# Patient Record
Sex: Female | Born: 2005 | Race: White | Hispanic: No | Marital: Single | Smoking: Never smoker
Health system: Southern US, Community
[De-identification: ages and names within clinical notes are randomized; demographics above are authoritative.]

## PROBLEM LIST (undated history)

## (undated) DIAGNOSIS — F419 Anxiety disorder, unspecified: Secondary | ICD-10-CM

## (undated) DIAGNOSIS — N159 Renal tubulo-interstitial disease, unspecified: Secondary | ICD-10-CM

## (undated) DIAGNOSIS — S52209A Unspecified fracture of shaft of unspecified ulna, initial encounter for closed fracture: Secondary | ICD-10-CM

## (undated) DIAGNOSIS — I499 Cardiac arrhythmia, unspecified: Secondary | ICD-10-CM

## (undated) DIAGNOSIS — S5290XA Unspecified fracture of unspecified forearm, initial encounter for closed fracture: Secondary | ICD-10-CM

## (undated) DIAGNOSIS — N200 Calculus of kidney: Secondary | ICD-10-CM

## (undated) DIAGNOSIS — N83209 Unspecified ovarian cyst, unspecified side: Secondary | ICD-10-CM

## (undated) DIAGNOSIS — K219 Gastro-esophageal reflux disease without esophagitis: Secondary | ICD-10-CM

## (undated) DIAGNOSIS — Q796 Ehlers-Danlos syndrome, unspecified: Secondary | ICD-10-CM

## (undated) HISTORY — PX: SHOULDER SURGERY: SHX246

## (undated) HISTORY — DX: Unspecified fracture of shaft of unspecified ulna, initial encounter for closed fracture: S52.209A

## (undated) HISTORY — PX: ANKLE SURGERY: SHX546

## (undated) HISTORY — DX: Unspecified fracture of unspecified forearm, initial encounter for closed fracture: S52.90XA

---
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
## 2023-01-03 ENCOUNTER — Other Ambulatory Visit: Payer: Self-pay | Admitting: Urology

## 2023-01-05 NOTE — Progress Notes (Signed)
Sent message, via epic in basket, requesting orders in epic from surgeon.  

## 2023-01-06 NOTE — Patient Instructions (Addendum)
SURGICAL WAITING ROOM VISITATION  Patients having surgery or a procedure may have no more than 2 support people in the waiting area - these visitors may rotate.    Children under the age of 62 must have an adult with them who is not the patient.  Due to an increase in RSV and influenza rates and associated hospitalizations, children ages 12 and under may not visit patients in [place].  If the patient needs to stay at the hospital during part of their recovery, the visitor guidelines for inpatient rooms apply. Pre-op nurse will coordinate an appropriate time for 1 support person to accompany patient in pre-op.  This support person may not rotate.    Please refer to the [facility] website for the visitor guidelines for Inpatients (after your surgery is over and you are in a regular room).    Your procedure is scheduled on: 01/10/23   Report to [facility] Main Entrance    Report to admitting at 11:00 AM   Call this number if you have problems the morning of surgery [phone]   Do not eat food or drink liquids :After Midnight.          If you have questions, please contact your surgeon's office.   FOLLOW BOWEL PREP AND ANY ADDITIONAL PRE OP INSTRUCTIONS YOU RECEIVED FROM YOUR SURGEON'S OFFICE!!!     Oral Hygiene is also important to reduce your risk of infection.                                    Remember - BRUSH YOUR TEETH THE MORNING OF SURGERY WITH YOUR REGULAR TOOTHPASTE  DENTURES WILL BE REMOVED PRIOR TO SURGERY PLEASE DO NOT APPLY "Poly grip" OR ADHESIVES!!!   Take these medicines the morning of surgery with A SIP OF WATER: Buspar, Fluoxetine, Tamsulosin, Tramadol                               You may not have any metal on your body including hair pins, jewelry, and body piercing             Do not wear make-up, lotions, powders, perfumes, or deodorant  Do not wear nail polish including gel and S&S, artificial/acrylic nails, or any other type of  covering on natural nails including finger and toenails. If you have artificial nails, gel coating, etc. that needs to be removed by a nail salon please have this removed prior to surgery or surgery may need to be canceled/ delayed if the surgeon/ anesthesia feels like they are unable to be safely monitored.   Do not shave  48 hours prior to surgery.    Do not bring valuables to the hospital. [place].   Contacts, glasses, dentures or bridgework may not be worn into surgery.  DO NOT [place]. PHARMACY WILL DISPENSE MEDICATIONS LISTED ON YOUR MEDICATION LIST TO YOU DURING YOUR ADMISSION [place]!    Patients discharged on the day of surgery will not be allowed to drive home.  Someone NEEDS to stay with you for the first 24 hours after anesthesia.              Please read over the following fact  sheets you were given: IF YOU HAVE QUESTIONS ABOUT YOUR PRE-OP INSTRUCTIONS PLEASE CALL [phone] Schneiders    If you received a COVID test during your pre-op visit  it is requested that you wear a mask when out in public, stay away from anyone that may not be feeling well and notify your surgeon if you develop symptoms. If you test positive for Covid or have been in contact with anyone that has tested positive in the last 10 days please notify you surgeon.    [place] - Preparing for Surgery Before surgery, you can play an important role.  Because skin is not sterile, your skin needs to be as free of germs as possible.  You can reduce the number of germs on your skin by washing with CHG (chlorahexidine gluconate) soap before surgery.  CHG is an antiseptic cleaner which kills germs and bonds with the skin to continue killing germs even after washing. Please DO NOT use if you have an allergy to CHG or antibacterial soaps.  If your skin becomes reddened/irritated stop using the CHG and inform your nurse when you  arrive at Short Stay. Do not shave (including legs and underarms) for at least 48 hours prior to the first CHG shower.  You may shave your face/neck.  Please follow these instructions carefully:  1.  Shower with CHG Soap the night before surgery and the  morning of surgery.  2.  If you choose to wash your hair, wash your hair first as usual with your normal  shampoo.  3.  After you shampoo, rinse your hair and body thoroughly to remove the shampoo.                             4.  Use CHG as you would any other liquid soap.  You can apply chg directly to the skin and wash.  Gently with a scrungie or clean washcloth.  5.  Apply the CHG Soap to your body ONLY FROM THE NECK DOWN.   Do   not use on face/ open                           Wound or open sores. Avoid contact with eyes, ears mouth and   genitals (private parts).                       Wash face,  Genitals (private parts) with your normal soap.             6.  Wash thoroughly, paying special attention to the area where your    surgery  will be performed.  7.  Thoroughly rinse your body with warm water from the neck down.  8.  DO NOT shower/wash with your normal soap after using and rinsing off the CHG Soap.                9.  Pat yourself dry with a clean towel.            10.  Wear clean pajamas.            11.  Place clean sheets on your bed the night of your first shower and do not  sleep with pets. Day of Surgery : Do not apply any lotions/deodorants the morning of surgery.  Please wear clean clothes to the hospital/surgery center.  FAILURE TO FOLLOW THESE  INSTRUCTIONS MAY RESULT IN THE CANCELLATION OF YOUR SURGERY  PATIENT SIGNATURE_________________________________  NURSE SIGNATURE__________________________________  ________________________________________________________________________

## 2023-01-06 NOTE — Progress Notes (Addendum)
COVID Vaccine Completed: yes  Date of COVID positive in last 90 days: no  PCP - Deon Pilling, NP Cardiologist - yes 5 years ago  Chest x-ray - 12/31/22 Epic EKG - 12/31/22 Epic sinus tachy Stress Test - yes ECHO - yes Cardiac Cath - n/a Pacemaker/ICD device last checked: n/a Spinal Cord Stimulator: n/a  Bowel Prep - no  Sleep Study - n/a CPAP -   Fasting Blood Sugar - n/a Checks Blood Sugar _____ times a day  Last dose of GLP1 agonist-  N/A GLP1 instructions:  N/A   Last dose of SGLT-2 inhibitors-  N/A SGLT-2 instructions: N/A   Blood Thinner Instructions: n/a Aspirin Instructions: Last Dose:  Activity level: Can go up a flight of stairs and perform activities of daily living without stopping and without symptoms of chest pain or shortness of breath.  Anesthesia review: dexamethazone during last surgery. Post surgery had increased HR  Patient denies shortness of breath, fever, cough and chest pain at PAT appointment  Patient verbalized understanding of instructions that were given to them at the PAT appointment. Patient was also instructed that they will need to review over the PAT instructions again at home before surgery.

## 2023-01-07 ENCOUNTER — Encounter (HOSPITAL_COMMUNITY): Payer: Self-pay

## 2023-01-07 ENCOUNTER — Encounter (HOSPITAL_COMMUNITY)
Admission: RE | Admit: 2023-01-07 | Discharge: 2023-01-07 | Disposition: A | Discharge status: Home / Self Care | Payer: 59 | Source: Ambulatory Visit | Attending: Urology | Admitting: Urology

## 2023-01-07 VITALS — BP 145/95 | HR 96 | Temp 99.0°F | Ht 63.0 in | Wt 228.0 lb

## 2023-01-07 DIAGNOSIS — K760 Fatty (change of) liver, not elsewhere classified: Secondary | ICD-10-CM | POA: Diagnosis not present

## 2023-01-07 DIAGNOSIS — Z01818 Encounter for other preprocedural examination: Secondary | ICD-10-CM

## 2023-01-07 DIAGNOSIS — Z01812 Encounter for preprocedural laboratory examination: Secondary | ICD-10-CM | POA: Diagnosis present

## 2023-01-07 HISTORY — DX: Anxiety disorder, unspecified: F41.9

## 2023-01-07 HISTORY — DX: Cardiac arrhythmia, unspecified: I49.9

## 2023-01-07 HISTORY — DX: Gastro-esophageal reflux disease without esophagitis: K21.9

## 2023-01-07 LAB — CBC
HCT: 40.5 % (ref 36.0–46.0)
Hemoglobin: 12.9 g/dL (ref 12.0–15.0)
MCH: 27.9 pg (ref 26.0–34.0)
MCHC: 31.9 g/dL (ref 30.0–36.0)
MCV: 87.5 fL (ref 80.0–100.0)
Platelets: 425 10*3/uL — ABNORMAL HIGH (ref 150–400)
RBC: 4.63 MIL/uL (ref 3.87–5.11)
RDW: 12.7 % (ref 11.5–15.5)
WBC: 8.8 10*3/uL (ref 4.0–10.5)
nRBC: 0 % (ref 0.0–0.2)

## 2023-01-07 LAB — COMPREHENSIVE METABOLIC PANEL
ALT: 18 U/L (ref 0–44)
AST: 18 U/L (ref 15–41)
Albumin: 4.3 g/dL (ref 3.5–5.0)
Alkaline Phosphatase: 62 U/L (ref 38–126)
Anion gap: 11 (ref 5–15)
BUN: 12 mg/dL (ref 6–20)
CO2: 24 mmol/L (ref 22–32)
Calcium: 9.4 mg/dL (ref 8.9–10.3)
Chloride: 101 mmol/L (ref 98–111)
Creatinine, Ser: 0.79 mg/dL (ref 0.44–1.00)
GFR, Estimated: >60 mL/min (ref >60)
Glucose, Bld: 80 mg/dL (ref 70–99)
Potassium: 4 mmol/L (ref 3.5–5.1)
Sodium: 136 mmol/L (ref 135–145)
Total Bilirubin: 0.5 mg/dL (ref 0.3–1.2)
Total Protein: 7.7 g/dL (ref 6.5–8.1)

## 2023-01-10 ENCOUNTER — Ambulatory Visit (HOSPITAL_COMMUNITY): Payer: 59

## 2023-01-10 ENCOUNTER — Encounter (HOSPITAL_COMMUNITY): Payer: Self-pay | Admitting: Urology

## 2023-01-10 ENCOUNTER — Ambulatory Visit (HOSPITAL_COMMUNITY): Payer: 59 | Admitting: Physician Assistant

## 2023-01-10 ENCOUNTER — Other Ambulatory Visit: Payer: Self-pay

## 2023-01-10 ENCOUNTER — Ambulatory Visit (HOSPITAL_COMMUNITY)
Admission: RE | Admit: 2023-01-10 | Discharge: 2023-01-10 | Disposition: A | Discharge status: Home / Self Care | Payer: 59 | Attending: Urology | Admitting: Urology

## 2023-01-10 ENCOUNTER — Ambulatory Visit (HOSPITAL_BASED_OUTPATIENT_CLINIC_OR_DEPARTMENT_OTHER): Payer: 59 | Admitting: Certified Registered"

## 2023-01-10 ENCOUNTER — Encounter (HOSPITAL_COMMUNITY)
Admission: RE | Disposition: A | Discharge status: Home / Self Care | Payer: Self-pay | Source: Home / Self Care | Attending: Urology

## 2023-01-10 DIAGNOSIS — N3943 Post-void dribbling: Secondary | ICD-10-CM | POA: Diagnosis not present

## 2023-01-10 DIAGNOSIS — Z87440 Personal history of urinary (tract) infections: Secondary | ICD-10-CM | POA: Insufficient documentation

## 2023-01-10 DIAGNOSIS — N200 Calculus of kidney: Secondary | ICD-10-CM

## 2023-01-10 DIAGNOSIS — F419 Anxiety disorder, unspecified: Secondary | ICD-10-CM

## 2023-01-10 DIAGNOSIS — Z6841 Body Mass Index (BMI) 40.0 and over, adult: Secondary | ICD-10-CM | POA: Insufficient documentation

## 2023-01-10 DIAGNOSIS — K219 Gastro-esophageal reflux disease without esophagitis: Secondary | ICD-10-CM | POA: Diagnosis not present

## 2023-01-10 DIAGNOSIS — N3020 Other chronic cystitis without hematuria: Secondary | ICD-10-CM | POA: Insufficient documentation

## 2023-01-10 DIAGNOSIS — N393 Stress incontinence (female) (male): Secondary | ICD-10-CM | POA: Insufficient documentation

## 2023-01-10 DIAGNOSIS — Q796 Ehlers-Danlos syndrome, unspecified: Secondary | ICD-10-CM | POA: Diagnosis not present

## 2023-01-10 DIAGNOSIS — M357 Hypermobility syndrome: Secondary | ICD-10-CM | POA: Insufficient documentation

## 2023-01-10 HISTORY — PX: CYSTOSCOPY/URETEROSCOPY/HOLMIUM LASER/STENT PLACEMENT: SHX6546

## 2023-01-10 SURGERY — CYSTOSCOPY/URETEROSCOPY/HOLMIUM LASER/STENT PLACEMENT
Anesthesia: General | Site: Ureter | Laterality: Left

## 2023-01-10 MED ORDER — PROPOFOL 10 MG/ML IV BOLUS
INTRAVENOUS | Status: DC | PRN
  Administered 2023-01-10: 200 mg via INTRAVENOUS

## 2023-01-10 MED ORDER — DEXAMETHASONE SODIUM PHOSPHATE 10 MG/ML IJ SOLN
INTRAMUSCULAR | Status: DC | PRN
  Administered 2023-01-10: 4 mg via INTRAVENOUS

## 2023-01-10 MED ORDER — 0.9 % SODIUM CHLORIDE (POUR BTL) OPTIME
TOPICAL | Status: DC | PRN
  Administered 2023-01-10: 1000 mL

## 2023-01-10 MED ORDER — FENTANYL CITRATE (PF) 100 MCG/2ML IJ SOLN
INTRAMUSCULAR | Status: AC
  Filled 2023-01-10: qty 2

## 2023-01-10 MED ORDER — MIDAZOLAM HCL 2 MG/2ML IJ SOLN
INTRAMUSCULAR | Status: DC | PRN
  Administered 2023-01-10 (×2): 1 mg via INTRAVENOUS

## 2023-01-10 MED ORDER — KETOROLAC TROMETHAMINE 30 MG/ML IJ SOLN
INTRAMUSCULAR | Status: DC | PRN
  Administered 2023-01-10: 30 mg via INTRAVENOUS

## 2023-01-10 MED ORDER — FENTANYL CITRATE (PF) 100 MCG/2ML IJ SOLN
INTRAMUSCULAR | Status: DC | PRN
  Administered 2023-01-10 (×4): 50 ug via INTRAVENOUS

## 2023-01-10 MED ORDER — ONDANSETRON HCL 4 MG/2ML IJ SOLN
INTRAMUSCULAR | Status: AC
  Filled 2023-01-10: qty 2

## 2023-01-10 MED ORDER — OXYCODONE HCL 5 MG PO TABS
5.0000 mg | ORAL_TABLET | Freq: Once | ORAL | Status: AC | PRN
  Administered 2023-01-10: 5 mg via ORAL

## 2023-01-10 MED ORDER — KETOROLAC TROMETHAMINE 30 MG/ML IJ SOLN
INTRAMUSCULAR | Status: AC
  Filled 2023-01-10: qty 1

## 2023-01-10 MED ORDER — SODIUM CHLORIDE 0.9 % IR SOLN
Status: DC | PRN
  Administered 2023-01-10: 3000 mL

## 2023-01-10 MED ORDER — FENTANYL CITRATE PF 50 MCG/ML IJ SOSY
PREFILLED_SYRINGE | INTRAMUSCULAR | Status: AC
  Filled 2023-01-10: qty 2

## 2023-01-10 MED ORDER — ONDANSETRON HCL 4 MG/2ML IJ SOLN
INTRAMUSCULAR | Status: DC | PRN
  Administered 2023-01-10: 4 mg via INTRAVENOUS

## 2023-01-10 MED ORDER — FENTANYL CITRATE PF 50 MCG/ML IJ SOSY
25.0000 ug | PREFILLED_SYRINGE | INTRAMUSCULAR | Status: DC | PRN
  Administered 2023-01-10 (×3): 50 ug via INTRAVENOUS

## 2023-01-10 MED ORDER — CHLORHEXIDINE GLUCONATE 0.12 % MT SOLN
15.0000 mL | Freq: Once | OROMUCOSAL | Status: AC
  Administered 2023-01-10: 15 mL via OROMUCOSAL

## 2023-01-10 MED ORDER — PROMETHAZINE HCL 25 MG/ML IJ SOLN: 6.2500 mg | INTRAMUSCULAR | Status: DC | PRN

## 2023-01-10 MED ORDER — OXYCODONE HCL 5 MG/5ML PO SOLN: 5.0000 mg | Freq: Once | ORAL | Status: AC | PRN

## 2023-01-10 MED ORDER — OXYCODONE HCL 5 MG PO TABS
ORAL_TABLET | ORAL | Status: AC
  Filled 2023-01-10: qty 1

## 2023-01-10 MED ORDER — LACTATED RINGERS IV SOLN: INTRAVENOUS | Status: DC

## 2023-01-10 MED ORDER — MIDAZOLAM HCL 2 MG/2ML IJ SOLN
INTRAMUSCULAR | Status: AC
  Filled 2023-01-10: qty 2

## 2023-01-10 MED ORDER — DEXMEDETOMIDINE HCL IN NACL 80 MCG/20ML IV SOLN
INTRAVENOUS | Status: DC | PRN
  Administered 2023-01-10 (×2): 4 ug via BUCCAL
  Administered 2023-01-10: 8 ug via BUCCAL

## 2023-01-10 MED ORDER — PROPOFOL 10 MG/ML IV BOLUS
INTRAVENOUS | Status: AC
  Filled 2023-01-10: qty 20

## 2023-01-10 MED ORDER — ORAL CARE MOUTH RINSE: 15.0000 mL | Freq: Once | OROMUCOSAL | Status: AC

## 2023-01-10 MED ORDER — LIDOCAINE 2% (20 MG/ML) 5 ML SYRINGE
INTRAMUSCULAR | Status: DC | PRN
  Administered 2023-01-10: 60 mg via INTRAVENOUS

## 2023-01-10 MED ORDER — CEFAZOLIN SODIUM-DEXTROSE 2-4 GM/100ML-% IV SOLN
2.0000 g | INTRAVENOUS | Status: AC
  Administered 2023-01-10: 2 g via INTRAVENOUS
  Filled 2023-01-10: qty 100

## 2023-01-10 MED ORDER — FENTANYL CITRATE PF 50 MCG/ML IJ SOSY
PREFILLED_SYRINGE | INTRAMUSCULAR | Status: AC
  Filled 2023-01-10: qty 1

## 2023-01-10 MED ORDER — DEXAMETHASONE SODIUM PHOSPHATE 10 MG/ML IJ SOLN
INTRAMUSCULAR | Status: AC
  Filled 2023-01-10: qty 1

## 2023-01-10 SURGICAL SUPPLY — 25 items
BAG URO CATCHER STRL LF (MISCELLANEOUS) ×1 IMPLANT
BASKET LASER NITINOL 1.9FR (BASKET) IMPLANT
BASKET ZERO TIP NITINOL 2.4FR (BASKET) IMPLANT
BSKT STON RTRVL 120 1.9FR (BASKET)
BSKT STON RTRVL ZERO TP 2.4FR (BASKET) ×1
CATH URETERAL DUAL LUMEN 10F (MISCELLANEOUS) IMPLANT
CATH URETL OPEN END 6FR 70 (CATHETERS) ×1 IMPLANT
CLOTH BEACON ORANGE TIMEOUT ST (SAFETY) ×1 IMPLANT
EXTRACTOR STONE 1.7FRX115CM (UROLOGICAL SUPPLIES) IMPLANT
GLOVE BIO SURGEON STRL SZ7.5 (GLOVE) ×1 IMPLANT
GOWN STRL REUS W/ TWL XL LVL3 (GOWN DISPOSABLE) ×1 IMPLANT
GOWN STRL REUS W/TWL XL LVL3 (GOWN DISPOSABLE) ×1
GUIDEWIRE ANG ZIPWIRE 038X150 (WIRE) IMPLANT
GUIDEWIRE STR DUAL SENSOR (WIRE) ×1 IMPLANT
KIT TURNOVER KIT A (KITS) IMPLANT
LASER FIB FLEXIVA PULSE ID 365 (Laser) IMPLANT
MANIFOLD NEPTUNE II (INSTRUMENTS) ×1 IMPLANT
PACK CYSTO (CUSTOM PROCEDURE TRAY) ×1 IMPLANT
SHEATH NAVIGATOR HD 11/13X28 (SHEATH) IMPLANT
SHEATH NAVIGATOR HD 11/13X36 (SHEATH) IMPLANT
STENT URET 6FRX24 CONTOUR (STENTS) IMPLANT
TRACTIP FLEXIVA PULS ID 200XHI (Laser) IMPLANT
TRACTIP FLEXIVA PULSE ID 200 (Laser)
TUBING CONNECTING 10 (TUBING) ×1 IMPLANT
TUBING UROLOGY SET (TUBING) ×1 IMPLANT

## 2023-01-10 NOTE — Transfer of Care (Signed)
Immediate Anesthesia Transfer of Care Note  Patient: Tanya Chambers  Procedure(s) Performed: CYSTOSCOPY LEFT URETEROSCOPY/STENT exchange (Left: Ureter)  Patient Location: PACU  Anesthesia Type:General  Level of Consciousness: sedated  Airway & Oxygen Therapy: Patient Spontanous Breathing and Patient connected to face mask oxygen  Post-op Assessment: Report given to RN and Post -op Vital signs reviewed and stable  Post vital signs: Reviewed and stable  Last Vitals:  Vitals Value Taken Time  BP 147/66 01/10/23 1448  Temp    Pulse 121 01/10/23 1449  Resp 13 01/10/23 1449  SpO2 98 % 01/10/23 1449  Vitals shown include unvalidated device data.  Last Pain:  Vitals:   01/10/23 1115  TempSrc: Oral         Complications: No notable events documented.

## 2023-01-10 NOTE — Anesthesia Preprocedure Evaluation (Addendum)
Anesthesia Evaluation  Patient identified by MRN, date of birth, ID band Patient awake    Reviewed: Allergy & Precautions, NPO status , Patient's Chart, lab work & pertinent test results  Airway Mallampati: I  TM Distance: >3 FB Neck ROM: Full    Dental  (+) Dental Advisory Given, Teeth Intact   Pulmonary neg pulmonary ROS   Pulmonary exam normal        Cardiovascular Normal cardiovascular exam+ dysrhythmias      Neuro/Psych  PSYCHIATRIC DISORDERS Anxiety     negative neurological ROS     GI/Hepatic Neg liver ROS,GERD  Controlled,,  Endo/Other    Morbid obesity  Renal/GU  Nephrolithiasis      Musculoskeletal  Ehlers-Danlos syndrome - hypermobile    Abdominal  (+) + obese  Peds  Hematology negative hematology ROS (+)   Anesthesia Other Findings Postoperative sinus tachycardia up to 150's after same procedure earlier this month which led to visit to ED. Workup benign, HR eventually trended down   Reproductive/Obstetrics                              Anesthesia Physical Anesthesia Plan  ASA: 3  Anesthesia Plan: General   Post-op Pain Management: Minimal or no pain anticipated   Induction: Intravenous  PONV Risk Score and Plan: 3 and Treatment may vary due to age or medical condition, Ondansetron, Dexamethasone and Midazolam  Airway Management Planned: LMA  Additional Equipment: None  Intra-op Plan:   Post-operative Plan: Extubation in OR  Informed Consent: I have reviewed the patients History and Physical, chart, labs and discussed the procedure including the risks, benefits and alternatives for the proposed anesthesia with the patient or authorized representative who has indicated his/her understanding and acceptance.     Dental advisory given  Plan Discussed with: CRNA and Anesthesiologist  Anesthesia Plan Comments:          Anesthesia Quick Evaluation

## 2023-01-10 NOTE — H&P (Signed)
CC/HPI: CC: Recurrent UTI, pelvic pain, hematuria  HPI:  09/17/2022  17 year old female states she has been getting urinary tract infections since the second grade. She has never seen a urologist. She denies a history of febrile UTIs. However, she states she had pyelonephritis about a year ago. She does states she has a history of nephrolithiasis. Last CT imaging was from a little bit over a year ago and showed 1 or 2 punctate nonobstructing calculi in the left kidney. No hydronephrosis. Renal ultrasound in February of this year showed bilateral nonobstructing calculi. She denies any flank pain. However, does have some intermittent hematuria usually with UTI symptoms. Also has microscopic hematuria today. She states she has seen a nephrologist in the past and underwent 24-hour urinalysis and was told she had no abnormalities in that regard. She does recently have a history of endometriosis and underwent laparoscopy for this. She had some postoperative urinary retention and since then her catheter was removed and she has experienced some urinary urgency and occasional dribbling with the urgency. She often feels like she is not going to make it to the restroom. UTI symptoms typically consist of some gross hematuria, terrible pain, dysuria and typically occur right after her period.   12/03/2022  Patient underwent a CT scan of the abdomen and pelvis without contrast that revealed bilateral renal calculi. Small stones but larger stone burden on the left. For the past several days she has had some dysuria and urinary frequency. Feels like UTI. She did come in today for cystoscopy for evaluation. However, she is symptomatic for UTI. She does continue to have urinary incontinence/postvoid dribbling. Has Ehlers-Danlos syndrome.     ALLERGIES: No Known Drug Allergies    MEDICATIONS: Eluryng  Hydroxyzine Hcl  Ibuprofen  Prozac  Tramadol Hcl     GU PSH: Locm 300-399Mg/Ml Iodine,1Ml - 10/01/2022       PSH  Notes: endometriosis     NON-GU PSH: Ankle Arthroscopy/surgery, Right Shoulder Arthroscopy/surgery, Right     GU PMH: Microscopic hematuria - 10/01/2022, - 09/17/2022 Chronic cystitis (w/o hematuria) - 09/17/2022 History of urolithiasis - 09/17/2022 Post-void dribbling - 09/17/2022 Urge incontinence - 09/17/2022 Urinary Urgency - 09/17/2022 Endometriosis, Unspec    NON-GU PMH: Anxiety GERD    FAMILY HISTORY: None   SOCIAL HISTORY: Marital Status: Single Preferred Language: English; Race: White Current Smoking Status: Patient has never smoked.   Tobacco Use Assessment Completed: Used Tobacco in last 30 days? Drinks 1 caffeinated drink per day.    REVIEW OF SYSTEMS:    GU Review Female:   Patient denies frequent urination, hard to postpone urination, burning /pain with urination, get up at night to urinate, leakage of urine, stream starts and stops, trouble starting your stream, have to strain to urinate, and being pregnant.  Gastrointestinal (Upper):   Patient denies nausea, vomiting, and indigestion/ heartburn.  Gastrointestinal (Lower):   Patient denies diarrhea and constipation.  Constitutional:   Patient denies fever, night sweats, weight loss, and fatigue.  Skin:   Patient denies skin rash/ lesion and itching.  Eyes:   Patient denies blurred vision and double vision.  Ears/ Nose/ Throat:   Patient denies sore throat and sinus problems.  Hematologic/Lymphatic:   Patient denies swollen glands and easy bruising.  Cardiovascular:   Patient denies leg swelling and chest pains.  Respiratory:   Patient denies cough and shortness of breath.  Endocrine:   Patient denies excessive thirst.  Musculoskeletal:   Patient denies back pain and joint pain.  Neurological:   Patient denies headaches and dizziness.  Psychologic:   Patient denies depression and anxiety.   VITAL SIGNS: None   Complexity of Data:  Source Of History:  Patient  Records Review:   Previous Doctor Records,  Previous Patient Records  Urine Test Review:   Urinalysis  X-Ray Review: C.T. Abdomen/Pelvis: Reviewed Films. Reviewed Report. Discussed With Patient.     PROCEDURES:          Urinalysis w/Scope Dipstick Dipstick Cont'd Micro  Color: Yellow Bilirubin: Neg mg/dL WBC/hpf: 0 - 5/hpf  Appearance: Slightly Cloudy Ketones: Neg mg/dL RBC/hpf: 3 - 10/hpf  Specific Gravity: 1.025 Blood: 3+ ery/uL Bacteria: Mod (26-50/hpf)  pH: 5.5 Protein: Neg mg/dL Cystals: NS (Not Seen)  Glucose: Neg mg/dL Urobilinogen: 0.2 mg/dL Casts: NS (Not Seen)    Nitrites: Neg Trichomonas: Not Present    Leukocyte Esterase: Neg leu/uL Mucous: Present      Epithelial Cells: 6 - 10/hpf      Yeast: NS (Not Seen)      Sperm: Not Present    ASSESSMENT:      ICD-10 Details  1 GU:   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, Stable  2   Stress Incontinence - N39.3 Chronic, Stable  3   Chronic cystitis (w/o hematuria) - N30.20 Chronic, Stable  4   Renal calculus - N20.0 Chronic, Stable     PLAN:            Medications New Meds: Amoxicillin-Clavulanate Potass 875 mg-125 mg tablet 1 tablet PO BID   #14  0 Refill(s)  Pharmacy Name:  [id number]  Address:  [place], [place] [id number]  Phone:  [phone]  Fax:  [phone]            Orders Labs Urine Culture          Schedule Return Visit/Planned Activity: Next Available Appointment - Cystoscopy          Document Letter(s):  Created for Patient: Clinical Summary         Notes:   She is symptomatic for likely UTI today. Therefore, hold off on cystoscopy. Will treat with Augmentin and send urine culture. If confirmed to be positive we will start on low-dose daily prophylaxis of culture specific antibiotic. She is allergic to Macrobid   Her primary complaint is stress urinary incontinence. We did discuss referral to a pelvic floor specialist. She wants to have cystoscopy first to rule out anatomical abnormality such as urethral  diverticulum. She has Ehlers-Danlos syndrome and certainly could have pelvic floor laxity due to this. She understands treatment for stress urinary incontinence is surgical or pelvic floor physical therapy.   For her renal calculi, the stones are small. She has a larger volume on the left. Would probably recommend against stone extraction on the right given the low volume and small stones. She does have larger stone burden on the left and I did offer her ureteroscopy with stone extraction of all the stones on the left. She wants to hold off at this time. The stones are large enough to likely pass on their own.           Next Appointment:      Next Appointment: 01/14/2023 11:15 AM    Appointment Type: Female Cysto    Location: [facility] [phone]    Provider: Link Snuffer, III, M.D.    Reason for Visit: next avail cysto  Signed by Link Snuffer, III, M.D. on 12/03/22 at 11:30 AM (EST

## 2023-01-10 NOTE — Anesthesia Procedure Notes (Signed)
Procedure Name: LMA Insertion Date/Time: 01/10/2023 1:51 PM  Performed by: Eben Burow, CRNAPre-anesthesia Checklist: Patient identified, Emergency Drugs available, Suction available, Patient being monitored and Timeout performed Patient Re-evaluated:Patient Re-evaluated prior to induction Oxygen Delivery Method: Circle system utilized Preoxygenation: Pre-oxygenation with 100% oxygen Induction Type: IV induction Ventilation: Mask ventilation without difficulty LMA: LMA inserted LMA Size: 4.0 Number of attempts: 1 Tube secured with: Tape Dental Injury: Teeth and Oropharynx as per pre-operative assessment

## 2023-01-10 NOTE — Discharge Instructions (Signed)

## 2023-01-10 NOTE — Op Note (Signed)
Operative Note  Preoperative diagnosis:  1.  Left renal calculi  Postoperative diagnosis: 1.  Left renal calculi  Procedure(s): 1.  Cystoscopy with left retrograde pyelogram, left ureteroscopy with stone extraction, left ureteral stent exchange  Surgeon: Link Snuffer, MD  Assistants: None  Anesthesia: General  Complications: None  EBL: Minimal  Specimens: 1.  Renal calculi  Drains/Catheters: 1.  6 x 24 double-J ureteral stent  Intraoperative findings: 1.  Normal urethra bladder 2.  Multiple left-sided renal calculi able to be basket extracted.  All clinically significant stones were removed.  Retrograde pyelogram revealed no filling defect and no hydronephrosis after treatment of the stones.  Indication: 17 year old female with left renal calculi desires the above operation.  Description of procedure:  The patient was identified and consent was obtained.  The patient was taken to the operating room and placed in the supine position.  The patient was placed under general anesthesia.  Perioperative antibiotics were administered.  The patient was placed in dorsal lithotomy.  Patient was prepped and draped in a standard sterile fashion and a timeout was performed.  A 21 French rigid cystoscope was advanced into the urethra and into the bladder.  Complete cystoscopy was performed with no abnormal findings.  The left stent was grasped and pulled just beyond the urethral meatus.  A sensor wire was advanced through this and into the kidney under fluoroscopic guidance.  Stent was withdrawn.  11 x 13 ureteral access sheath was advanced over the wire under continuous fluoroscopic guidance.  Inner sheath withdrawn and a second wire was advanced through the sheath and into the kidney.  The sheath was removed and one of the wires was secured to the drape as a safety wire.  The other wire was used to advance the access sheath over it and up the ureter up to the proximal ureter.  Inner sheath and  wire were withdrawn.  Digital ureteroscopy was performed and numerous stones were basket extracted.  All clinically significant stones were removed until tiny stones too small to basket remained.  I shot a retrograde pyelogram through the scope with the findings noted above.  I withdrew the scope along with the access sheath visualizing the ureter upon removal.  There were no ureteral calculi and no ureteral injury was identified.  I backloaded the wire onto the rigid cystoscope and advanced that into the bladder followed by routine placement of a 6 x 24 double-J ureteral stent.  On fluoroscopy, it appeared the proximal portion of the stent was malpositioned.  I therefore reposition the stent into the renal pelvis.  There was a good coil in the bladder.  This include the operation.  I drained the bladder and withdrew the scope.  Patient tolerated the procedure well was stable postoperatively.  Plan: Follow-up in 5 days for stent removal.

## 2023-01-11 ENCOUNTER — Encounter (HOSPITAL_COMMUNITY): Payer: Self-pay | Admitting: Urology

## 2023-01-11 NOTE — Anesthesia Postprocedure Evaluation (Signed)
Anesthesia Post Note  Patient: Tanya Chambers  Procedure(s) Performed: CYSTOSCOPY LEFT URETEROSCOPY/STENT exchange (Left: Ureter)     Patient location during evaluation: PACU Anesthesia Type: General Level of consciousness: awake and alert Pain management: pain level controlled Vital Signs Assessment: post-procedure vital signs reviewed and stable Respiratory status: spontaneous breathing, nonlabored ventilation and respiratory function stable Cardiovascular status: stable and blood pressure returned to baseline Anesthetic complications: no   No notable events documented.  Last Vitals:  Vitals:   01/10/23 1530 01/10/23 1545  BP: (!) 151/92 (!) 151/91  Pulse: 93 93  Resp: 19   Temp: 36.7 C   SpO2: 96% 97%    Last Pain:  Vitals:   01/10/23 1545  TempSrc:   PainSc: [place]

## 2023-04-26 ENCOUNTER — Other Ambulatory Visit: Payer: Self-pay | Admitting: Orthopaedic Surgery

## 2023-04-26 DIAGNOSIS — M25571 Pain in right ankle and joints of right foot: Secondary | ICD-10-CM

## 2023-05-19 ENCOUNTER — Ambulatory Visit
Admission: RE | Admit: 2023-05-19 | Discharge: 2023-05-19 | Disposition: A | Discharge status: Home / Self Care | Payer: 59 | Source: Ambulatory Visit | Attending: Orthopaedic Surgery | Admitting: Orthopaedic Surgery

## 2023-05-19 DIAGNOSIS — M25571 Pain in right ankle and joints of right foot: Secondary | ICD-10-CM

## 2023-11-09 ENCOUNTER — Encounter (INDEPENDENT_AMBULATORY_CARE_PROVIDER_SITE_OTHER): Payer: Self-pay

## 2023-12-08 ENCOUNTER — Other Ambulatory Visit (HOSPITAL_BASED_OUTPATIENT_CLINIC_OR_DEPARTMENT_OTHER): Payer: Self-pay | Admitting: Sports Medicine

## 2023-12-08 ENCOUNTER — Ambulatory Visit (HOSPITAL_BASED_OUTPATIENT_CLINIC_OR_DEPARTMENT_OTHER)
Admission: RE | Admit: 2023-12-08 | Discharge: 2023-12-08 | Disposition: A | Discharge status: Home / Self Care | Payer: 59 | Source: Ambulatory Visit | Attending: Sports Medicine | Admitting: Sports Medicine

## 2023-12-08 ENCOUNTER — Encounter (HOSPITAL_BASED_OUTPATIENT_CLINIC_OR_DEPARTMENT_OTHER): Payer: Self-pay

## 2023-12-08 DIAGNOSIS — M25551 Pain in right hip: Secondary | ICD-10-CM
